# Patient Record
Sex: Male | Born: 2005
Health system: Southern US, Community
[De-identification: ages and names within clinical notes are randomized; demographics above are authoritative.]

## PROBLEM LIST (undated history)

## (undated) HISTORY — PX: CIRCUMCISION: SUR203

---
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
## 2023-01-15 DIAGNOSIS — Z419 Encounter for procedure for purposes other than remedying health state, unspecified: Secondary | ICD-10-CM | POA: Diagnosis not present

## 2023-02-14 DIAGNOSIS — Z419 Encounter for procedure for purposes other than remedying health state, unspecified: Secondary | ICD-10-CM | POA: Diagnosis not present

## 2023-03-17 DIAGNOSIS — Z419 Encounter for procedure for purposes other than remedying health state, unspecified: Secondary | ICD-10-CM | POA: Diagnosis not present

## 2023-04-17 DIAGNOSIS — Z419 Encounter for procedure for purposes other than remedying health state, unspecified: Secondary | ICD-10-CM | POA: Diagnosis not present

## 2023-05-15 DIAGNOSIS — Z419 Encounter for procedure for purposes other than remedying health state, unspecified: Secondary | ICD-10-CM | POA: Diagnosis not present

## 2023-06-26 DIAGNOSIS — Z419 Encounter for procedure for purposes other than remedying health state, unspecified: Secondary | ICD-10-CM | POA: Diagnosis not present

## 2023-07-26 DIAGNOSIS — Z419 Encounter for procedure for purposes other than remedying health state, unspecified: Secondary | ICD-10-CM | POA: Diagnosis not present

## 2023-08-26 DIAGNOSIS — Z419 Encounter for procedure for purposes other than remedying health state, unspecified: Secondary | ICD-10-CM | POA: Diagnosis not present

## 2023-09-25 DIAGNOSIS — Z419 Encounter for procedure for purposes other than remedying health state, unspecified: Secondary | ICD-10-CM | POA: Diagnosis not present

## 2023-10-26 DIAGNOSIS — Z419 Encounter for procedure for purposes other than remedying health state, unspecified: Secondary | ICD-10-CM | POA: Diagnosis not present

## 2023-11-26 DIAGNOSIS — Z419 Encounter for procedure for purposes other than remedying health state, unspecified: Secondary | ICD-10-CM | POA: Diagnosis not present

## 2024-01-26 DIAGNOSIS — Z419 Encounter for procedure for purposes other than remedying health state, unspecified: Secondary | ICD-10-CM | POA: Diagnosis not present
